# Patient Record
Sex: Female | Born: 1995
Health system: Southern US, Community
[De-identification: ages and names within clinical notes are randomized; demographics above are authoritative.]

## PROBLEM LIST (undated history)

## (undated) DIAGNOSIS — Z8659 Personal history of other mental and behavioral disorders: Secondary | ICD-10-CM

## (undated) DIAGNOSIS — F419 Anxiety disorder, unspecified: Secondary | ICD-10-CM

## (undated) DIAGNOSIS — F329 Major depressive disorder, single episode, unspecified: Secondary | ICD-10-CM

## (undated) HISTORY — PX: WISDOM TOOTH EXTRACTION: SHX21

## (undated) HISTORY — DX: Personal history of other mental and behavioral disorders: Z86.59

## (undated) HISTORY — DX: Major depressive disorder, single episode, unspecified: F32.9

## (undated) HISTORY — DX: Anxiety disorder, unspecified: F41.9

---

## 2016-02-29 DIAGNOSIS — F331 Major depressive disorder, recurrent, moderate: Secondary | ICD-10-CM | POA: Diagnosis not present

## 2016-02-29 DIAGNOSIS — F603 Borderline personality disorder: Secondary | ICD-10-CM | POA: Diagnosis not present

## 2016-03-13 DIAGNOSIS — F331 Major depressive disorder, recurrent, moderate: Secondary | ICD-10-CM | POA: Diagnosis not present

## 2016-03-13 DIAGNOSIS — F603 Borderline personality disorder: Secondary | ICD-10-CM | POA: Diagnosis not present

## 2016-03-17 DIAGNOSIS — Z113 Encounter for screening for infections with a predominantly sexual mode of transmission: Secondary | ICD-10-CM | POA: Diagnosis not present

## 2016-03-17 DIAGNOSIS — Z01419 Encounter for gynecological examination (general) (routine) without abnormal findings: Secondary | ICD-10-CM | POA: Diagnosis not present

## 2016-03-17 DIAGNOSIS — Z118 Encounter for screening for other infectious and parasitic diseases: Secondary | ICD-10-CM | POA: Diagnosis not present

## 2016-03-27 DIAGNOSIS — F603 Borderline personality disorder: Secondary | ICD-10-CM | POA: Diagnosis not present

## 2016-03-27 DIAGNOSIS — F331 Major depressive disorder, recurrent, moderate: Secondary | ICD-10-CM | POA: Diagnosis not present

## 2016-04-10 DIAGNOSIS — F331 Major depressive disorder, recurrent, moderate: Secondary | ICD-10-CM | POA: Diagnosis not present

## 2016-04-10 DIAGNOSIS — F603 Borderline personality disorder: Secondary | ICD-10-CM | POA: Diagnosis not present

## 2016-04-24 DIAGNOSIS — F331 Major depressive disorder, recurrent, moderate: Secondary | ICD-10-CM | POA: Diagnosis not present

## 2016-04-24 DIAGNOSIS — F603 Borderline personality disorder: Secondary | ICD-10-CM | POA: Diagnosis not present

## 2016-05-09 DIAGNOSIS — F603 Borderline personality disorder: Secondary | ICD-10-CM | POA: Diagnosis not present

## 2016-05-09 DIAGNOSIS — F331 Major depressive disorder, recurrent, moderate: Secondary | ICD-10-CM | POA: Diagnosis not present

## 2016-05-16 DIAGNOSIS — R0981 Nasal congestion: Secondary | ICD-10-CM | POA: Diagnosis not present

## 2016-05-16 DIAGNOSIS — J019 Acute sinusitis, unspecified: Secondary | ICD-10-CM | POA: Diagnosis not present

## 2016-05-16 DIAGNOSIS — R5383 Other fatigue: Secondary | ICD-10-CM | POA: Diagnosis not present

## 2016-05-25 DIAGNOSIS — F331 Major depressive disorder, recurrent, moderate: Secondary | ICD-10-CM | POA: Diagnosis not present

## 2016-05-25 DIAGNOSIS — F603 Borderline personality disorder: Secondary | ICD-10-CM | POA: Diagnosis not present

## 2016-06-06 DIAGNOSIS — F331 Major depressive disorder, recurrent, moderate: Secondary | ICD-10-CM | POA: Diagnosis not present

## 2016-06-06 DIAGNOSIS — F603 Borderline personality disorder: Secondary | ICD-10-CM | POA: Diagnosis not present

## 2016-07-19 DIAGNOSIS — F603 Borderline personality disorder: Secondary | ICD-10-CM | POA: Diagnosis not present

## 2016-07-19 DIAGNOSIS — F331 Major depressive disorder, recurrent, moderate: Secondary | ICD-10-CM | POA: Diagnosis not present

## 2016-08-09 DIAGNOSIS — F331 Major depressive disorder, recurrent, moderate: Secondary | ICD-10-CM | POA: Diagnosis not present

## 2016-08-09 DIAGNOSIS — F603 Borderline personality disorder: Secondary | ICD-10-CM | POA: Diagnosis not present

## 2016-08-23 DIAGNOSIS — F603 Borderline personality disorder: Secondary | ICD-10-CM | POA: Diagnosis not present

## 2016-08-23 DIAGNOSIS — F331 Major depressive disorder, recurrent, moderate: Secondary | ICD-10-CM | POA: Diagnosis not present

## 2016-11-06 DIAGNOSIS — F3281 Premenstrual dysphoric disorder: Secondary | ICD-10-CM | POA: Diagnosis not present

## 2016-11-27 DIAGNOSIS — F3281 Premenstrual dysphoric disorder: Secondary | ICD-10-CM | POA: Diagnosis not present

## 2017-01-01 DIAGNOSIS — F3281 Premenstrual dysphoric disorder: Secondary | ICD-10-CM | POA: Diagnosis not present

## 2017-01-13 DIAGNOSIS — F3281 Premenstrual dysphoric disorder: Secondary | ICD-10-CM | POA: Diagnosis not present

## 2017-01-24 DIAGNOSIS — F3281 Premenstrual dysphoric disorder: Secondary | ICD-10-CM | POA: Diagnosis not present

## 2017-01-31 DIAGNOSIS — Z23 Encounter for immunization: Secondary | ICD-10-CM | POA: Diagnosis not present

## 2017-01-31 DIAGNOSIS — Z3009 Encounter for other general counseling and advice on contraception: Secondary | ICD-10-CM | POA: Diagnosis not present

## 2017-01-31 DIAGNOSIS — F329 Major depressive disorder, single episode, unspecified: Secondary | ICD-10-CM | POA: Diagnosis not present

## 2017-02-05 DIAGNOSIS — F3281 Premenstrual dysphoric disorder: Secondary | ICD-10-CM | POA: Diagnosis not present

## 2017-02-13 DIAGNOSIS — Z3202 Encounter for pregnancy test, result negative: Secondary | ICD-10-CM | POA: Diagnosis not present

## 2017-02-13 DIAGNOSIS — Z30430 Encounter for insertion of intrauterine contraceptive device: Secondary | ICD-10-CM | POA: Diagnosis not present

## 2017-02-15 DIAGNOSIS — F3281 Premenstrual dysphoric disorder: Secondary | ICD-10-CM | POA: Diagnosis not present

## 2017-02-22 DIAGNOSIS — F3281 Premenstrual dysphoric disorder: Secondary | ICD-10-CM | POA: Diagnosis not present

## 2017-03-08 DIAGNOSIS — F3281 Premenstrual dysphoric disorder: Secondary | ICD-10-CM | POA: Diagnosis not present

## 2017-03-13 DIAGNOSIS — Z30431 Encounter for routine checking of intrauterine contraceptive device: Secondary | ICD-10-CM | POA: Diagnosis not present

## 2017-03-22 DIAGNOSIS — F3281 Premenstrual dysphoric disorder: Secondary | ICD-10-CM | POA: Diagnosis not present

## 2017-04-13 ENCOUNTER — Ambulatory Visit (HOSPITAL_COMMUNITY)
Admission: EM | Admit: 2017-04-13 | Discharge: 2017-04-13 | Disposition: A | Discharge status: Home / Self Care | Payer: Federal, State, Local not specified - PPO | Attending: Family Medicine | Admitting: Family Medicine

## 2017-04-13 ENCOUNTER — Encounter (HOSPITAL_COMMUNITY): Payer: Self-pay | Admitting: Emergency Medicine

## 2017-04-13 DIAGNOSIS — Z79899 Other long term (current) drug therapy: Secondary | ICD-10-CM | POA: Insufficient documentation

## 2017-04-13 DIAGNOSIS — R102 Pelvic and perineal pain: Secondary | ICD-10-CM | POA: Diagnosis not present

## 2017-04-13 DIAGNOSIS — Z3202 Encounter for pregnancy test, result negative: Secondary | ICD-10-CM | POA: Diagnosis not present

## 2017-04-13 DIAGNOSIS — Z975 Presence of (intrauterine) contraceptive device: Secondary | ICD-10-CM | POA: Diagnosis not present

## 2017-04-13 LAB — POCT URINALYSIS DIP (DEVICE)
Bilirubin Urine: NEGATIVE
Glucose, UA: NEGATIVE mg/dL
Nitrite: NEGATIVE
PH: 5.5 (ref 5.0–8.0)
PROTEIN: NEGATIVE mg/dL
UROBILINOGEN UA: 0.2 mg/dL (ref 0.0–1.0)

## 2017-04-13 LAB — POCT PREGNANCY, URINE: Preg Test, Ur: NEGATIVE

## 2017-04-13 MED ORDER — CIPROFLOXACIN HCL 500 MG PO TABS: 500.0000 mg | ORAL_TABLET | Freq: Two times a day (BID) | ORAL | 0 refills | Status: DC

## 2017-04-13 MED ORDER — DOXYCYCLINE HYCLATE 100 MG PO TABS: 100.0000 mg | ORAL_TABLET | Freq: Two times a day (BID) | ORAL | 0 refills | Status: DC

## 2017-04-13 NOTE — ED Provider Notes (Signed)
[facility]ENTER   [id number] 04/13/17 Arrival Time: 1336   SUBJECTIVE:  Karen Poole is a 22 y.o. female who presents to the urgent care with complaint of pelvic pain for 2-3 days.   IUD insertion 2 months ago. PT cannot feel IUD strings. PT's OB is in [place]  History reviewed. No pertinent past medical history. No family history on file. Social History   Socioeconomic History  . Marital status: Single    Spouse name: Not on file  . Number of children: Not on file  . Years of education: Not on file  . Highest education level: Not on file  Social Needs  . Financial resource strain: Not on file  . Food insecurity - worry: Not on file  . Food insecurity - inability: Not on file  . Transportation needs - medical: Not on file  . Transportation needs - non-medical: Not on file  Occupational History  . Not on file  Tobacco Use  . Smoking status: Not on file  Substance and Sexual Activity  . Alcohol use: Not on file  . Drug use: Not on file  . Sexual activity: Not on file  Other Topics Concern  . Not on file  Social History Narrative  . Not on file   Current Meds  Medication Sig  . FLUoxetine (PROZAC) 20 MG tablet Take 20 mg by mouth daily.   No Known Allergies    ROS: As per HPI, remainder of ROS negative.   OBJECTIVE:   Vitals:   04/13/17 1445 04/13/17 1446  BP:  (!) 97/54  Pulse:  83  Resp:  16  Temp:  97.8 F (36.6 C)  TempSrc:  Temporal  SpO2:  100%  Weight: 170 lb (77.1 kg)      General appearance: alert; no distress Eyes: PERRL; EOMI; conjunctiva normal HENT: normocephalic; atraumatic;oral mucosa normal Neck: supple Lungs: clear to auscultation bilaterally Heart: regular rate and rhythm Abdomen: soft, diffusely tender; bowel sounds normal; no masses or organomegaly; there is guarding or rebound tenderness;  The IUD string is in place;  There is some cervical motion tenderness.; NEFG;  Minimal menstrual blood in  vault Back: no CVA tenderness Extremities: no cyanosis or edema; symmetrical with no gross deformities Skin: warm and dry Neurologic: normal gait; grossly normal Psychological: alert and cooperative; normal mood and affect      Labs:  Results for orders placed or performed during the hospital encounter of 04/13/17  POCT urinalysis dip (device)  Result Value Ref Range   Glucose, UA NEGATIVE NEGATIVE mg/dL   Bilirubin Urine NEGATIVE NEGATIVE   Ketones, ur TRACE (A) NEGATIVE mg/dL   Specific Gravity, Urine >=1.030 1.005 - 1.030   Hgb urine dipstick LARGE (A) NEGATIVE   pH 5.5 5.0 - 8.0   Protein, ur NEGATIVE NEGATIVE mg/dL   Urobilinogen, UA 0.2 0.0 - 1.0 mg/dL   Nitrite NEGATIVE NEGATIVE   Leukocytes, UA TRACE (A) NEGATIVE  Pregnancy, urine POC  Result Value Ref Range   Preg Test, Ur NEGATIVE NEGATIVE    Labs Reviewed  POCT URINALYSIS DIP (DEVICE) - Abnormal; Notable for the following components:      Result Value   Ketones, ur TRACE (*)    Hgb urine dipstick LARGE (*)    Leukocytes, UA TRACE (*)    All other components within normal limits  URINE CULTURE  POCT PREGNANCY, URINE  URINE CYTOLOGY ANCILLARY ONLY    No results found.     ASSESSMENT & PLAN:  1. Pelvic pain in female   If not improving as expected, [facility]  Meds ordered this encounter  Medications  . ciprofloxacin (CIPRO) 500 MG tablet    Sig: Take 1 tablet (500 mg total) by mouth 2 (two) times daily.    Dispense:  14 tablet    Refill:  0  . doxycycline (VIBRA-TABS) 100 MG tablet    Sig: Take 1 tablet (100 mg total) by mouth 2 (two) times daily.    Dispense:  14 tablet    Refill:  0    Reviewed expectations re: course of current medical issues. Questions answered. Outlined signs and symptoms indicating need for more acute intervention. Patient verbalized understanding. After Visit Summary given.    Procedures:      Elvina SidleLauenstein, Jeannene Tschetter, MD 04/13/17 1538

## 2017-04-13 NOTE — Discharge Instructions (Signed)
If not improving as expected, go to [facility]

## 2017-04-13 NOTE — ED Triage Notes (Signed)
PT reports pelvic pain for 2-3 days.   IUD insertion 2 months ago. PT cannot feel IUD strings. PT's OB is in [place]

## 2017-04-14 LAB — URINE CULTURE: Culture: NO GROWTH

## 2017-04-16 LAB — URINE CYTOLOGY ANCILLARY ONLY
Chlamydia: POSITIVE — AB
Neisseria Gonorrhea: NEGATIVE
Trichomonas: NEGATIVE

## 2017-04-18 LAB — URINE CYTOLOGY ANCILLARY ONLY
Bacterial vaginitis: NEGATIVE
Candida vaginitis: NEGATIVE

## 2017-05-03 DIAGNOSIS — F3281 Premenstrual dysphoric disorder: Secondary | ICD-10-CM | POA: Diagnosis not present

## 2017-05-17 DIAGNOSIS — F3281 Premenstrual dysphoric disorder: Secondary | ICD-10-CM | POA: Diagnosis not present

## 2017-05-31 DIAGNOSIS — F3281 Premenstrual dysphoric disorder: Secondary | ICD-10-CM | POA: Diagnosis not present

## 2017-06-26 DIAGNOSIS — Z01419 Encounter for gynecological examination (general) (routine) without abnormal findings: Secondary | ICD-10-CM | POA: Diagnosis not present

## 2017-06-26 DIAGNOSIS — Z113 Encounter for screening for infections with a predominantly sexual mode of transmission: Secondary | ICD-10-CM | POA: Diagnosis not present

## 2017-06-26 DIAGNOSIS — Z124 Encounter for screening for malignant neoplasm of cervix: Secondary | ICD-10-CM | POA: Diagnosis not present

## 2017-06-26 DIAGNOSIS — Z6829 Body mass index (BMI) 29.0-29.9, adult: Secondary | ICD-10-CM | POA: Diagnosis not present

## 2017-06-26 DIAGNOSIS — Z1389 Encounter for screening for other disorder: Secondary | ICD-10-CM | POA: Diagnosis not present

## 2017-06-26 DIAGNOSIS — Z975 Presence of (intrauterine) contraceptive device: Secondary | ICD-10-CM | POA: Diagnosis not present

## 2017-06-26 DIAGNOSIS — Z130 Encounter for screening for diseases of the blood and blood-forming organs and certain disorders involving the immune mechanism: Secondary | ICD-10-CM | POA: Diagnosis not present

## 2017-06-27 DIAGNOSIS — Z124 Encounter for screening for malignant neoplasm of cervix: Secondary | ICD-10-CM | POA: Diagnosis not present

## 2017-06-27 LAB — HM PAP SMEAR: HM PAP: NEGATIVE

## 2017-08-01 DIAGNOSIS — Z6830 Body mass index (BMI) 30.0-30.9, adult: Secondary | ICD-10-CM | POA: Diagnosis not present

## 2017-08-01 DIAGNOSIS — M62830 Muscle spasm of back: Secondary | ICD-10-CM | POA: Diagnosis not present

## 2017-10-04 DIAGNOSIS — F3281 Premenstrual dysphoric disorder: Secondary | ICD-10-CM | POA: Diagnosis not present

## 2017-10-17 DIAGNOSIS — F3281 Premenstrual dysphoric disorder: Secondary | ICD-10-CM | POA: Diagnosis not present

## 2017-10-17 LAB — HM COLONOSCOPY

## 2017-10-24 DIAGNOSIS — F3281 Premenstrual dysphoric disorder: Secondary | ICD-10-CM | POA: Diagnosis not present

## 2017-10-31 DIAGNOSIS — F3281 Premenstrual dysphoric disorder: Secondary | ICD-10-CM | POA: Diagnosis not present

## 2017-11-14 DIAGNOSIS — F3281 Premenstrual dysphoric disorder: Secondary | ICD-10-CM | POA: Diagnosis not present

## 2017-11-26 ENCOUNTER — Ambulatory Visit: Payer: Self-pay | Admitting: Family Medicine

## 2017-11-28 DIAGNOSIS — F3281 Premenstrual dysphoric disorder: Secondary | ICD-10-CM | POA: Diagnosis not present

## 2017-11-29 DIAGNOSIS — R350 Frequency of micturition: Secondary | ICD-10-CM | POA: Diagnosis not present

## 2017-12-01 DIAGNOSIS — F411 Generalized anxiety disorder: Secondary | ICD-10-CM | POA: Diagnosis not present

## 2017-12-12 DIAGNOSIS — F3281 Premenstrual dysphoric disorder: Secondary | ICD-10-CM | POA: Diagnosis not present

## 2017-12-21 DIAGNOSIS — F3281 Premenstrual dysphoric disorder: Secondary | ICD-10-CM | POA: Diagnosis not present

## 2018-01-09 DIAGNOSIS — F3281 Premenstrual dysphoric disorder: Secondary | ICD-10-CM | POA: Diagnosis not present

## 2018-01-21 ENCOUNTER — Ambulatory Visit (INDEPENDENT_AMBULATORY_CARE_PROVIDER_SITE_OTHER): Payer: BLUE CROSS/BLUE SHIELD | Admitting: Family Medicine

## 2018-01-21 ENCOUNTER — Encounter: Payer: Self-pay | Admitting: Family Medicine

## 2018-01-21 ENCOUNTER — Other Ambulatory Visit: Payer: Self-pay | Admitting: Internal Medicine

## 2018-01-21 ENCOUNTER — Ambulatory Visit
Admission: RE | Admit: 2018-01-21 | Discharge: 2018-01-21 | Disposition: A | Discharge status: Home / Self Care | Payer: Federal, State, Local not specified - PPO | Source: Ambulatory Visit | Attending: Family Medicine | Admitting: Family Medicine

## 2018-01-21 VITALS — BP 110/70 | HR 59 | Ht 67.0 in | Wt 189.6 lb

## 2018-01-21 DIAGNOSIS — Z8349 Family history of other endocrine, nutritional and metabolic diseases: Secondary | ICD-10-CM | POA: Insufficient documentation

## 2018-01-21 DIAGNOSIS — Z0000 Encounter for general adult medical examination without abnormal findings: Secondary | ICD-10-CM | POA: Diagnosis not present

## 2018-01-21 DIAGNOSIS — M545 Low back pain, unspecified: Secondary | ICD-10-CM

## 2018-01-21 DIAGNOSIS — Z1329 Encounter for screening for other suspected endocrine disorder: Secondary | ICD-10-CM | POA: Diagnosis not present

## 2018-01-21 DIAGNOSIS — G8929 Other chronic pain: Secondary | ICD-10-CM

## 2018-01-21 DIAGNOSIS — F419 Anxiety disorder, unspecified: Secondary | ICD-10-CM

## 2018-01-21 DIAGNOSIS — F32A Depression, unspecified: Secondary | ICD-10-CM | POA: Insufficient documentation

## 2018-01-21 DIAGNOSIS — F329 Major depressive disorder, single episode, unspecified: Secondary | ICD-10-CM | POA: Insufficient documentation

## 2018-01-21 DIAGNOSIS — Z8659 Personal history of other mental and behavioral disorders: Secondary | ICD-10-CM

## 2018-01-21 HISTORY — DX: Personal history of other mental and behavioral disorders: Z86.59

## 2018-01-21 HISTORY — DX: Depression, unspecified: F32.A

## 2018-01-21 HISTORY — DX: Anxiety disorder, unspecified: F41.9

## 2018-01-21 LAB — POCT URINALYSIS DIP (PROADVANTAGE DEVICE)
BILIRUBIN UA: NEGATIVE
Blood, UA: NEGATIVE
GLUCOSE UA: NEGATIVE mg/dL
Ketones, POC UA: NEGATIVE mg/dL
LEUKOCYTES UA: NEGATIVE
NITRITE UA: NEGATIVE
Protein Ur, POC: NEGATIVE mg/dL
Specific Gravity, Urine: 1.025
Urobilinogen, Ur: NEGATIVE
pH, UA: 6 (ref 5.0–8.0)

## 2018-01-21 MED ORDER — MELOXICAM 7.5 MG PO TABS: 7.5000 mg | ORAL_TABLET | Freq: Every day | ORAL | 0 refills | Status: DC

## 2018-01-21 NOTE — Patient Instructions (Addendum)
Go to [facility] to get your XR at your convenience.  I am referring you to physical therapy and they will call you.   Chronic Back Pain When back pain lasts longer than 3 months, it is called chronic back pain. Pain may get worse at certain times (flare-ups). There are things you can do at home to manage your pain. Follow these instructions at home: Activity      Avoid bending and other activities that make pain worse.  When standing: ? Keep your upper back and neck straight. ? Keep your shoulders pulled back. ? Avoid slouching.  When sitting: ? Keep your back straight. ? Relax your shoulders. Do not round your shoulders or pull them backward.  Do not sit or stand in one place for long periods of time.  Take short rest breaks during the day. Lying down or standing is usually better than sitting. Resting can help relieve pain.  When sitting or lying down for a long time, do some mild activity or stretching. This will help to prevent stiffness and pain.  Get regular exercise. Ask your doctor what activities are safe for you.  Do not lift anything that is heavier than 10 lb (4.5 kg). To prevent injury when you lift things: ? Bend your knees. ? Keep the weight close to your body. ? Avoid twisting. Managing pain  If told, put ice on the painful area. Your doctor may tell you to use ice for 24-48 hours after a flare-up starts. ? Put ice in a plastic bag. ? Place a towel between your skin and the bag. ? Leave the ice on for 20 minutes, 2-3 times a day.  If told, put heat on the painful area as often as told by your doctor. Use the heat source that your doctor recommends, such as a moist heat pack or a heating pad. ? Place a towel between your skin and the heat source. ? Leave the heat on for 20-30 minutes. ? Remove the heat if your skin turns bright red. This is especially important if you are unable to feel pain, heat, or cold. You may have a greater risk of getting  burned.  Soak in a warm bath. This can help relieve pain.  Take over-the-counter and prescription medicines only as told by your doctor. General instructions  Sleep on a firm mattress. Try lying on your side with your knees slightly bent. If you lie on your back, put a pillow under your knees.  Keep all follow-up visits as told by your doctor. This is important. Contact a doctor if:  You have pain that does not get better with rest or medicine. Get help right away if:  One or both of your arms or legs feel weak.  One or both of your arms or legs lose feeling (numbness).  You have trouble controlling when you poop (bowel movement) or pee (urinate).  You feel sick to your stomach (nauseous).  You throw up (vomit).  You have belly (abdominal) pain.  You have shortness of breath.  You pass out (faint). Summary  When back pain lasts longer than 3 months, it is called chronic back pain.  Pain may get worse at certain times (flare-ups).  Use ice and heat as told by your doctor. Your doctor may tell you to use ice after flare-ups. This information is not intended to replace advice given to you by your health care provider. Make sure you discuss any questions you have with your health  care provider. Document Released: 07/05/2007 Document Revised: 08/31/2016 Document Reviewed: 08/31/2016 Elsevier Interactive Patient Education  2019 [place] 18-39 Years, Female Preventive care refers to lifestyle choices and visits with your health care provider that can promote health and wellness. What does preventive care include?   A yearly physical exam. This is also called an annual well check.  Dental exams once or twice a year.  Routine eye exams. Ask your health care provider how often you should have your eyes checked.  Personal lifestyle choices, including: ? Daily care of your teeth and gums. ? Regular physical activity. ? Eating a healthy  diet. ? Avoiding tobacco and drug use. ? Limiting alcohol use. ? Practicing safe sex. ? Taking vitamin and mineral supplements as recommended by your health care provider. What happens during an annual well check? The services and screenings done by your health care provider during your annual well check will depend on your age, overall health, lifestyle risk factors, and family history of disease. Counseling Your health care provider may ask you questions about your:  Alcohol use.  Tobacco use.  Drug use.  Emotional well-being.  Home and relationship well-being.  Sexual activity.  Eating habits.  Work and work Statistician.  Method of birth control.  Menstrual cycle.  Pregnancy history. Screening You may have the following tests or measurements:  Height, weight, and BMI.  Diabetes screening. This is done by checking your blood sugar (glucose) after you have not eaten for a while (fasting).  Blood pressure.  Lipid and cholesterol levels. These may be checked every 5 years starting at age 48.  Skin check.  Hepatitis C blood test.  Hepatitis B blood test.  Sexually transmitted disease (STD) testing.  BRCA-related cancer screening. This may be done if you have a family history of breast, ovarian, tubal, or peritoneal cancers.  Pelvic exam and Pap test. This may be done every 3 years starting at age 34. Starting at age 30, this may be done every 5 years if you have a Pap test in combination with an HPV test. Discuss your test results, treatment options, and if necessary, the need for more tests with your health care provider. Vaccines Your health care provider may recommend certain vaccines, such as:  Influenza vaccine. This is recommended every year.  Tetanus, diphtheria, and acellular pertussis (Tdap, Td) vaccine. You may need a Td booster every 10 years.  Varicella vaccine. You may need this if you have not been vaccinated.  HPV vaccine. If you are 71 or  younger, you may need three doses over 6 months.  Measles, mumps, and rubella (MMR) vaccine. You may need at least one dose of MMR. You may also need a second dose.  Pneumococcal 13-valent conjugate (PCV13) vaccine. You may need this if you have certain conditions and were not previously vaccinated.  Pneumococcal polysaccharide (PPSV23) vaccine. You may need one or two doses if you smoke cigarettes or if you have certain conditions.  Meningococcal vaccine. One dose is recommended if you are age 58-21 years and a first-year college student living in a residence hall, or if you have one of several medical conditions. You may also need additional booster doses.  Hepatitis A vaccine. You may need this if you have certain conditions or if you travel or work in places where you may be exposed to hepatitis A.  Hepatitis B vaccine. You may need this if you have certain conditions or if you travel or work in  places where you may be exposed to hepatitis B.  Haemophilus influenzae type b (Hib) vaccine. You may need this if you have certain risk factors. Talk to your health care provider about which screenings and vaccines you need and how often you need them. This information is not intended to replace advice given to you by your health care provider. Make sure you discuss any questions you have with your health care provider. Document Released: 03/14/2001 Document Revised: 08/29/2016 Document Reviewed: 11/17/2014 Elsevier Interactive Patient Education  2019 [employer].

## 2018-01-21 NOTE — Progress Notes (Signed)
Subjective:    Patient ID: Karen Poole, female    DOB: 09-18-1995, 22 y.o.   MRN: [id number]  HPI Chief Complaint  Patient presents with  . cpe    cpe, back pain- month and half- bending forward and back to much. sees obgyn, gets eyes check yearly. declines flu shot   She is new to the practice and here for a complete physical exam.  She also has a complaint of low back pain. [place] medical care[place] in [place], [place]   Other providers: [facility] - [facility]  Eyes  Counselor   Complains of 4-6 month history of intermittent low back pain that is non radiating. Pain is gradually worsening and more constant.  Describes pain as a dull ache and sharp with certain movements.  No known injury. Sitting for long periods and leaning forward makes the pain worse. Laying down improves the pain. Laying on her stomach also helps.  History back "spasms" per patient. States she was seen and prescribed muscle relaxants.   Has tried heating pad, taking Aleve. These therapies help temporarily.  No history of steroid use. No fever, chills. Denies loss of control of bowels or bladder or urinary retention. Denies numbness, tingling or weakness.   She does wear a heavy backpack at college.   Urinary frequency and hesitancy one month ago.  These symptoms did resolve.  Social history: Lives in university apartments. works PT at [employer]. [employer] social work degree as of next year.  Denies smoking cigarettes.  Drinks alcohol socially, smokes marijuana daily.  Diet: fairly healthy. Late night snacks.  Excerise: walks daily. 2 times per week   Immunizations: reports being UTD including Gardasil. No records available.   Health maintenance:  Mammogram: N/A Colonoscopy: N/A Last Gynecological Exam: Jun 26 2017  Last Menstrual cycle: 01/14/2018 IUD and condoms Pregnancies: 0 Last Dental Exam: last year  Last Eye Exam: 2 months ago   Wears seatbelt always, uses sunscreen,  smoke detectors in home and functioning, does not text while driving and feels safe in home environment.   Reviewed allergies, medications, past medical, surgical, family, and social history.     Review of Systems Review of Systems Constitutional: -fever, -chills, -sweats, -unexpected weight change,-fatigue ENT: -runny nose, -ear pain, -sore throat Cardiology:  -chest pain, -palpitations, -edema Respiratory: -cough, -shortness of breath, -wheezing Gastroenterology: -abdominal pain, -nausea, -vomiting, -diarrhea, -constipation  Hematology: -bleeding or bruising problems Musculoskeletal: -arthralgias, -myalgias, -joint swelling, +back pain Ophthalmology: -vision changes Urology: -dysuria, -difficulty urinating, -hematuria, -urinary frequency, -urgency Neurology: -headache, -weakness, -tingling, -numbness       Objective:   Physical Exam BP 110/70   Pulse (!) 59   Ht 5\' 7"  (1.702 m)   Wt 189 lb 9.6 oz (86 kg)   LMP 01/14/2018   BMI 29.70 kg/m   General Appearance:    Alert, cooperative, no distress, appears stated age  Head:    Normocephalic, without obvious abnormality, atraumatic  Eyes:    PERRL, conjunctiva/corneas clear, EOM's intact, fundi    benign  Ears:    Normal TM's and external ear canals  Nose:   Nares normal, mucosa normal, no drainage or sinus   tenderness  Throat:   Lips, mucosa, and tongue normal; teeth and gums normal  Neck:   Supple, no lymphadenopathy;  thyroid:  no   enlargement/tenderness/nodules; no carotid   bruit or JVD  Back:    cervical and thoracic spine nontender. Lumbar spine and lumbar paraspinal muscle TTP. Limited  ROM and pain with flexion, extension and rotation. Negative straight leg weakness, DTRs normal and symmetric.   Lungs:     Clear to auscultation bilaterally without wheezes, rales or     ronchi; respirations unlabored  Chest Wall:    No tenderness or deformity   Heart:    Regular rate and rhythm, S1 and S2 normal, no murmur, rub   or  gallop  Breast Exam:    OB/GYN  Abdomen:     Soft, non-tender, nondistended, normoactive bowel sounds,    no masses, no hepatosplenomegaly  Genitalia:    OB/GYN  Rectal:    Not performed due to age<40 and no related complaints  Extremities:   No clubbing, cyanosis or edema  Pulses:   2+ and symmetric all extremities  Skin:   Skin color, texture, turgor normal, no rashes or lesions  Lymph nodes:   Cervical, supraclavicular, and axillary nodes normal  Neurologic:   CNII-XII intact, normal strength, sensation and gait; reflexes 2+ and symmetric throughout          Psych:   Normal mood, affect, hygiene and grooming.     Urinalysis dipstick: negative       Assessment & Plan:  Routine general medical examination at a health care facility - Plan: POCT Urinalysis DIP (Proadvantage Device), TSH, T4, free, CBC with Differential/Platelet, Comprehensive metabolic panel  Chronic bilateral low back pain without sciatica - Plan: DG Lumbar Spine Complete, POCT Urinalysis DIP (Proadvantage Device), Ambulatory referral to Physical Therapy  Screening for thyroid disorder - Plan: TSH, T4, free  Family history of thyroid disease in sister - Plan: TSH, T4, free  Family history of thyroid disease in mother - Plan: TSH, T4, free  She is a 22 year old pleasant female who is new to the practice and here for a CPE.  She also has complaints of ongoing low back pain for several months.  No known injury of her back.  Denies previous x-ray or work-up for back pain.  No red flag symptoms. I will send her for lumbar spine x-ray and physical therapy Will get records including pap smear and immunizations  Family history significant for thyroid disease.  She requests thyroid panel today.  She declines lipid screening. Counseling on healthy diet and exercise.  I also encouraged her to stop smoking marijuana since this may interfere with her medication as well have potential long term health consequences.  Follow up  pending results.

## 2018-01-22 LAB — COMPREHENSIVE METABOLIC PANEL
ALBUMIN: 4.6 g/dL (ref 3.5–5.5)
ALK PHOS: 91 IU/L (ref 39–117)
ALT: 24 IU/L (ref 0–32)
AST: 24 IU/L (ref 0–40)
Albumin/Globulin Ratio: 2.2 (ref 1.2–2.2)
BUN / CREAT RATIO: 13 (ref 9–23)
BUN: 12 mg/dL (ref 6–20)
Bilirubin Total: 0.4 mg/dL (ref 0.0–1.2)
CHLORIDE: 101 mmol/L (ref 96–106)
CO2: 24 mmol/L (ref 20–29)
CREATININE: 0.89 mg/dL (ref 0.57–1.00)
Calcium: 8.9 mg/dL (ref 8.7–10.2)
GFR calc Af Amer: 107 mL/min/{1.73_m2} (ref >59)
GFR calc non Af Amer: 93 mL/min/{1.73_m2} (ref >59)
GLUCOSE: 92 mg/dL (ref 65–99)
Globulin, Total: 2.1 g/dL (ref 1.5–4.5)
Potassium: 4.2 mmol/L (ref 3.5–5.2)
Sodium: 138 mmol/L (ref 134–144)
TOTAL PROTEIN: 6.7 g/dL (ref 6.0–8.5)

## 2018-01-22 LAB — CBC WITH DIFFERENTIAL/PLATELET
BASOS ABS: 0.1 10*3/uL (ref 0.0–0.2)
Basos: 1 %
EOS (ABSOLUTE): 0.2 10*3/uL (ref 0.0–0.4)
Eos: 2 %
HEMOGLOBIN: 11.9 g/dL (ref 11.1–15.9)
Hematocrit: 36.8 % (ref 34.0–46.6)
IMMATURE GRANS (ABS): 0 10*3/uL (ref 0.0–0.1)
Immature Granulocytes: 0 %
LYMPHS: 25 %
Lymphocytes Absolute: 2 10*3/uL (ref 0.7–3.1)
MCH: 27.7 pg (ref 26.6–33.0)
MCHC: 32.3 g/dL (ref 31.5–35.7)
MCV: 86 fL (ref 79–97)
MONOCYTES: 10 %
Monocytes Absolute: 0.8 10*3/uL (ref 0.1–0.9)
Neutrophils Absolute: 5.1 10*3/uL (ref 1.4–7.0)
Neutrophils: 62 %
PLATELETS: 203 10*3/uL (ref 150–450)
RBC: 4.29 x10E6/uL (ref 3.77–5.28)
RDW: 13.3 % (ref 12.3–15.4)
WBC: 8.1 10*3/uL (ref 3.4–10.8)

## 2018-01-22 LAB — TSH: TSH: 3.99 u[IU]/mL (ref 0.450–4.500)

## 2018-01-22 LAB — T4, FREE: FREE T4: 1.14 ng/dL (ref 0.82–1.77)

## 2018-02-06 DIAGNOSIS — F3281 Premenstrual dysphoric disorder: Secondary | ICD-10-CM | POA: Diagnosis not present

## 2018-02-11 DIAGNOSIS — M25561 Pain in right knee: Secondary | ICD-10-CM | POA: Diagnosis not present

## 2018-02-11 DIAGNOSIS — M545 Low back pain: Secondary | ICD-10-CM | POA: Diagnosis not present

## 2018-02-11 DIAGNOSIS — M25562 Pain in left knee: Secondary | ICD-10-CM | POA: Diagnosis not present

## 2018-02-15 ENCOUNTER — Telehealth: Payer: Self-pay | Admitting: Family Medicine

## 2018-02-15 DIAGNOSIS — N390 Urinary tract infection, site not specified: Secondary | ICD-10-CM | POA: Diagnosis not present

## 2018-02-15 DIAGNOSIS — M545 Low back pain: Secondary | ICD-10-CM | POA: Diagnosis not present

## 2018-02-15 DIAGNOSIS — M25561 Pain in right knee: Secondary | ICD-10-CM | POA: Diagnosis not present

## 2018-02-15 DIAGNOSIS — M25562 Pain in left knee: Secondary | ICD-10-CM | POA: Diagnosis not present

## 2018-02-15 DIAGNOSIS — R350 Frequency of micturition: Secondary | ICD-10-CM | POA: Diagnosis not present

## 2018-02-15 NOTE — Telephone Encounter (Signed)
Received requested records from [facility]. Received pap. Sending back for review.

## 2018-02-18 DIAGNOSIS — M545 Low back pain: Secondary | ICD-10-CM | POA: Diagnosis not present

## 2018-02-18 DIAGNOSIS — M25562 Pain in left knee: Secondary | ICD-10-CM | POA: Diagnosis not present

## 2018-02-18 DIAGNOSIS — M25561 Pain in right knee: Secondary | ICD-10-CM | POA: Diagnosis not present

## 2018-02-20 DIAGNOSIS — F3281 Premenstrual dysphoric disorder: Secondary | ICD-10-CM | POA: Diagnosis not present

## 2018-02-22 ENCOUNTER — Encounter: Payer: Self-pay | Admitting: Internal Medicine

## 2018-02-22 DIAGNOSIS — M25562 Pain in left knee: Secondary | ICD-10-CM | POA: Diagnosis not present

## 2018-02-22 DIAGNOSIS — M25561 Pain in right knee: Secondary | ICD-10-CM | POA: Diagnosis not present

## 2018-02-22 DIAGNOSIS — M545 Low back pain: Secondary | ICD-10-CM | POA: Diagnosis not present

## 2018-02-25 DIAGNOSIS — M25561 Pain in right knee: Secondary | ICD-10-CM | POA: Diagnosis not present

## 2018-02-25 DIAGNOSIS — M25562 Pain in left knee: Secondary | ICD-10-CM | POA: Diagnosis not present

## 2018-02-25 DIAGNOSIS — M545 Low back pain: Secondary | ICD-10-CM | POA: Diagnosis not present

## 2018-03-01 DIAGNOSIS — M25561 Pain in right knee: Secondary | ICD-10-CM | POA: Diagnosis not present

## 2018-03-01 DIAGNOSIS — M545 Low back pain: Secondary | ICD-10-CM | POA: Diagnosis not present

## 2018-03-01 DIAGNOSIS — M25562 Pain in left knee: Secondary | ICD-10-CM | POA: Diagnosis not present

## 2018-03-06 DIAGNOSIS — F3281 Premenstrual dysphoric disorder: Secondary | ICD-10-CM | POA: Diagnosis not present

## 2018-03-07 ENCOUNTER — Telehealth: Payer: Self-pay | Admitting: Family Medicine

## 2018-03-07 NOTE — Telephone Encounter (Signed)
Received requested records from [facility]. Sending back for review.

## 2018-03-18 ENCOUNTER — Encounter: Payer: Self-pay | Admitting: Family Medicine

## 2018-03-22 ENCOUNTER — Encounter: Payer: Self-pay | Admitting: Internal Medicine

## 2018-03-27 DIAGNOSIS — F3281 Premenstrual dysphoric disorder: Secondary | ICD-10-CM | POA: Diagnosis not present

## 2018-04-17 DIAGNOSIS — F3281 Premenstrual dysphoric disorder: Secondary | ICD-10-CM | POA: Diagnosis not present

## 2018-04-30 DIAGNOSIS — F3281 Premenstrual dysphoric disorder: Secondary | ICD-10-CM | POA: Diagnosis not present

## 2018-05-13 ENCOUNTER — Other Ambulatory Visit: Payer: Self-pay

## 2018-05-13 ENCOUNTER — Encounter: Payer: Self-pay | Admitting: Family Medicine

## 2018-05-13 ENCOUNTER — Ambulatory Visit: Payer: Federal, State, Local not specified - PPO | Admitting: Family Medicine

## 2018-05-13 VITALS — Wt 185.0 lb

## 2018-05-13 DIAGNOSIS — M5441 Lumbago with sciatica, right side: Secondary | ICD-10-CM | POA: Diagnosis not present

## 2018-05-13 NOTE — Progress Notes (Signed)
Documentation for Telephone encounter:  This telephone service is not related to other E/M service within previous 7 days.  Patient consented to the consult. 2 patient identifiers used. She is aware that we will bill her insurance.   This telephone consult involved patient and myself, Hetty Blend, NP  Subjective: Chief Complaint  Patient presents with  . back pain    a year of back pain, went to PT and got alittle better, but now that she is not moving as much now she is not in school. back pain is affecting sex life and she is in alot of pain   Complains of a 4 week history of low back pain that is worse on her right side. Reports a history of chronic back pain. No new injury. Pain has occasionally radiated down the back of her right leg for the past 1-2 weeks. Pain is worse with sitting. Improves some when walking.  Pain with sleeping and she has to get up. States the pain is interfering with her job and with her sex life.  Denies fever, chills, unexplained weight loss, abdominal pain, N/V/D. No numbness, tingling or weakness. No loss of control of bowels or bladder.   Currently she is taking Tylenol.   Has been to PT in the past, states it did not really help.   Meloxicam in the past and she did not think this really helped.   IUD for contraception.    Objective: Wt 185 lb (83.9 kg)   LMP 04/29/2018   BMI 28.98 kg/m   Alert and oriented and in no acute distress.   Assessment: Acute right-sided low back pain with right-sided sciatica    Plan: No acute distress.  Does not have any red flag symptoms.  Reviewed lumbar XR with patient.  Take 1-2 Aleve twice daily with food. Use heat or ice 2-3 times per day for 15-20 minutes each time. Try a topical analgesic such as Salon Pas or Biofreeze.  Do the PT stretches she learned. Discussed using steroids or muscle relaxants and these do not seem appropriate at this time.   She was advised to follow up if worsening or if not  improving in 1-2 weeks. Advised of red flag symptoms.  She is currently in [place] and may want a referral to ortho if not improving. Does not want to be referred back to PT.  Answered all of her questions.    Time involving medical discussion was 12 minutes.  99441 (5-8min) 60045 (11-74min) 99774 (21-51min)

## 2018-05-21 DIAGNOSIS — F3281 Premenstrual dysphoric disorder: Secondary | ICD-10-CM | POA: Diagnosis not present

## 2018-06-14 DIAGNOSIS — F3281 Premenstrual dysphoric disorder: Secondary | ICD-10-CM | POA: Diagnosis not present

## 2018-07-02 DIAGNOSIS — F3281 Premenstrual dysphoric disorder: Secondary | ICD-10-CM | POA: Diagnosis not present

## 2018-07-04 DIAGNOSIS — Z975 Presence of (intrauterine) contraceptive device: Secondary | ICD-10-CM | POA: Diagnosis not present

## 2018-07-04 DIAGNOSIS — Z6828 Body mass index (BMI) 28.0-28.9, adult: Secondary | ICD-10-CM | POA: Diagnosis not present

## 2018-07-04 DIAGNOSIS — Z130 Encounter for screening for diseases of the blood and blood-forming organs and certain disorders involving the immune mechanism: Secondary | ICD-10-CM | POA: Diagnosis not present

## 2018-07-04 DIAGNOSIS — Z01419 Encounter for gynecological examination (general) (routine) without abnormal findings: Secondary | ICD-10-CM | POA: Diagnosis not present

## 2018-07-04 DIAGNOSIS — Z113 Encounter for screening for infections with a predominantly sexual mode of transmission: Secondary | ICD-10-CM | POA: Diagnosis not present

## 2018-07-19 ENCOUNTER — Telehealth: Payer: Self-pay | Admitting: *Deleted

## 2018-07-19 ENCOUNTER — Telehealth: Payer: Self-pay | Admitting: Family Medicine

## 2018-07-19 DIAGNOSIS — Z20822 Contact with and (suspected) exposure to covid-19: Secondary | ICD-10-CM

## 2018-07-19 NOTE — Telephone Encounter (Signed)
-----   Message from Koren Bound, [place] sent at 07/19/2018  2:41 PM EDT ----- Patient needs to be tested for Covid-19   Sabrina

## 2018-07-19 NOTE — Telephone Encounter (Signed)
Next available appointment @ [facility] for covid testing is Monday 07/22/18. Patient does not want to wait until Monday. She wants to go to [facility] to [facility] tested but needs an order from her PCP. Patient will call her PCP to request to be tested by [facility]. States she was at a protest.

## 2018-07-19 NOTE — Telephone Encounter (Signed)
Pt needs to be sent for testing because she was in a protest  Send to [facility] per Vickie Pt informed

## 2018-07-19 NOTE — Telephone Encounter (Signed)
Sent [facility] a note for testing

## 2018-07-19 NOTE — Telephone Encounter (Signed)
Pt returned call and was scheduled for testing on 07/22/18 at [facility] site. Pt advised to wear a mask and to remain in car at appt time. Understanding verbalized.

## 2018-07-22 ENCOUNTER — Other Ambulatory Visit: Payer: Federal, State, Local not specified - PPO

## 2018-07-22 DIAGNOSIS — R6889 Other general symptoms and signs: Secondary | ICD-10-CM | POA: Diagnosis not present

## 2018-07-22 DIAGNOSIS — Z20822 Contact with and (suspected) exposure to covid-19: Secondary | ICD-10-CM

## 2018-07-27 LAB — NOVEL CORONAVIRUS, NAA: SARS-CoV-2, NAA: NOT DETECTED

## 2018-07-30 DIAGNOSIS — F3281 Premenstrual dysphoric disorder: Secondary | ICD-10-CM | POA: Diagnosis not present

## 2018-08-19 DIAGNOSIS — F3281 Premenstrual dysphoric disorder: Secondary | ICD-10-CM | POA: Diagnosis not present

## 2018-09-05 ENCOUNTER — Encounter: Payer: Self-pay | Admitting: Family Medicine

## 2018-09-24 ENCOUNTER — Other Ambulatory Visit: Payer: Federal, State, Local not specified - PPO

## 2018-09-24 ENCOUNTER — Other Ambulatory Visit: Payer: Self-pay

## 2018-09-24 DIAGNOSIS — Z111 Encounter for screening for respiratory tuberculosis: Secondary | ICD-10-CM | POA: Diagnosis not present

## 2018-09-26 ENCOUNTER — Other Ambulatory Visit: Payer: Self-pay

## 2018-09-26 ENCOUNTER — Encounter: Payer: Self-pay | Admitting: Family Medicine

## 2018-09-26 ENCOUNTER — Ambulatory Visit (INDEPENDENT_AMBULATORY_CARE_PROVIDER_SITE_OTHER): Payer: Federal, State, Local not specified - PPO | Admitting: Family Medicine

## 2018-09-26 VITALS — Temp 98.6°F | Wt 180.0 lb

## 2018-09-26 DIAGNOSIS — F3281 Premenstrual dysphoric disorder: Secondary | ICD-10-CM | POA: Diagnosis not present

## 2018-09-26 DIAGNOSIS — F411 Generalized anxiety disorder: Secondary | ICD-10-CM

## 2018-09-26 LAB — QUANTIFERON-TB GOLD PLUS
QuantiFERON Mitogen Value: >10 IU/mL
QuantiFERON Nil Value: 0.02 IU/mL
QuantiFERON TB1 Ag Value: 0.02 IU/mL
QuantiFERON TB2 Ag Value: 0.02 IU/mL
QuantiFERON-TB Gold Plus: NEGATIVE

## 2018-09-26 NOTE — Progress Notes (Signed)
   Subjective:   Documentation for virtual audio and video telecommunications through [place] encounter:  The patient was located at home. 2 identifiers used.  The provider was located in the office. The patient did consent to this visit and is aware of possible charges through their insurance for this visit.  The other persons participating in this telemedicine service were none.    Patient ID: Karen Poole, female    DOB: [birth year], 23 y.o.   MRN: [id number]  HPI Chief Complaint  Patient presents with  . 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   She has been taking Prozac daily for the past 6 years for GAD.  She is also seeing a therapist. [facility] in [place].  Doing well. Mood is stable. No new concerns.  Denies feeling depressed.   Rarely drinks alcohol or uses marijuana.   Denies fever, chills, dizziness, chest pain, palpitations, shortness of breath, abdominal pain, N/V/D.    Reviewed allergies, medications, past medical, surgical, family, and social history.     Review of Systems Pertinent positives and negatives in the history of present illness.     Objective:   Physical Exam Temp 98.6 F (37 C)   Wt 180 lb (81.6 kg)   BMI 28.19 kg/m   Alert and oriented and in no acute distress.  Normal mood, thought process.      Assessment & Plan:  GAD (generalized anxiety disorder)  She is doing well on Prozac. Mood is good. No depression. GAD controlled. Continue seeing a Social worker.  Discussed that I have no hesitation filling out a form stating she is safe to work with children.  Once we have her QuantiFERON gold test result, she needs to be called so that she can come and pick up the paperwork.

## 2018-10-02 DIAGNOSIS — F3281 Premenstrual dysphoric disorder: Secondary | ICD-10-CM | POA: Diagnosis not present

## 2018-11-16 DIAGNOSIS — F3281 Premenstrual dysphoric disorder: Secondary | ICD-10-CM | POA: Diagnosis not present

## 2018-12-04 DIAGNOSIS — Z20828 Contact with and (suspected) exposure to other viral communicable diseases: Secondary | ICD-10-CM | POA: Diagnosis not present

## 2018-12-14 DIAGNOSIS — F3281 Premenstrual dysphoric disorder: Secondary | ICD-10-CM | POA: Diagnosis not present

## 2019-01-10 DIAGNOSIS — Z23 Encounter for immunization: Secondary | ICD-10-CM | POA: Diagnosis not present

## 2019-01-10 DIAGNOSIS — Z03818 Encounter for observation for suspected exposure to other biological agents ruled out: Secondary | ICD-10-CM | POA: Diagnosis not present

## 2019-01-10 DIAGNOSIS — Z20828 Contact with and (suspected) exposure to other viral communicable diseases: Secondary | ICD-10-CM | POA: Diagnosis not present

## 2019-01-21 DIAGNOSIS — F3281 Premenstrual dysphoric disorder: Secondary | ICD-10-CM | POA: Diagnosis not present

## 2019-02-02 IMAGING — CR DG LUMBAR SPINE COMPLETE 4+V
5 series · 5 of 5 positions shown · non-contrast
Comparison: None.

CLINICAL DATA: Lumbago, primarily right-sided

EXAM:
LUMBAR SPINE - COMPLETE 4+ VIEW

[t l-spine a.p.]
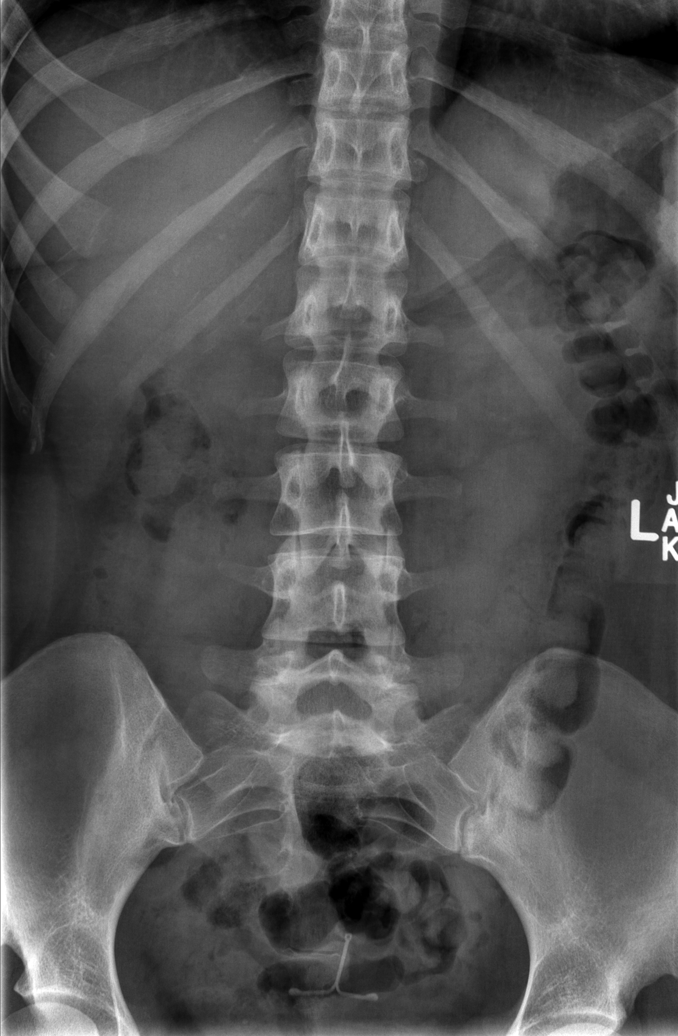

[t l-spine oblique exposure (1 of 2)]
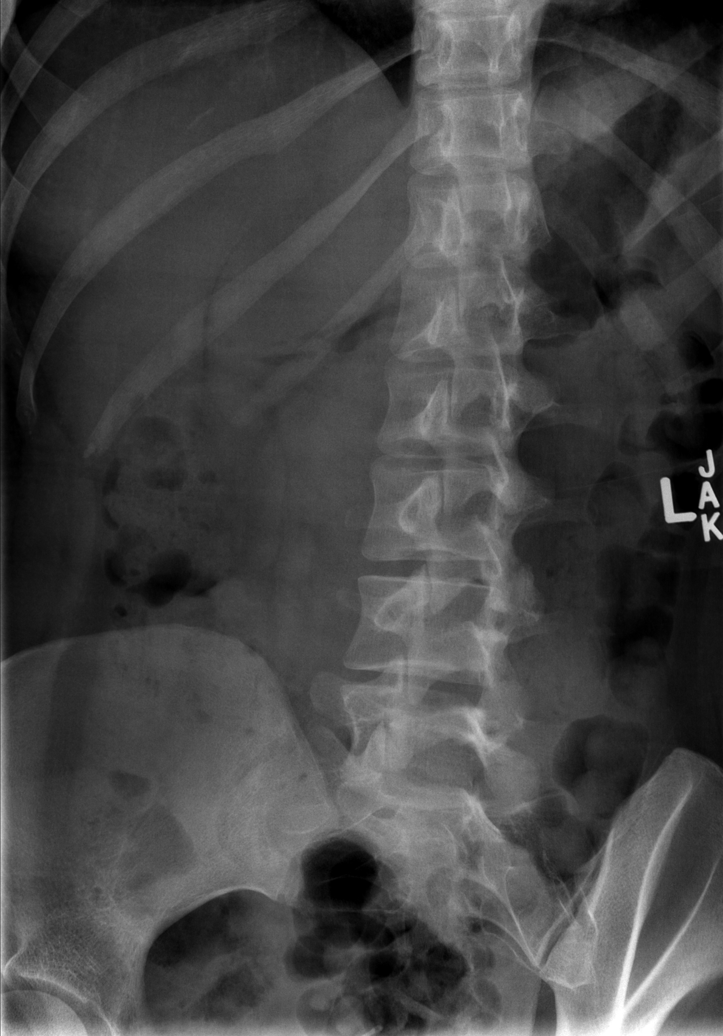

[t l-spine oblique exposure (2 of 2)]
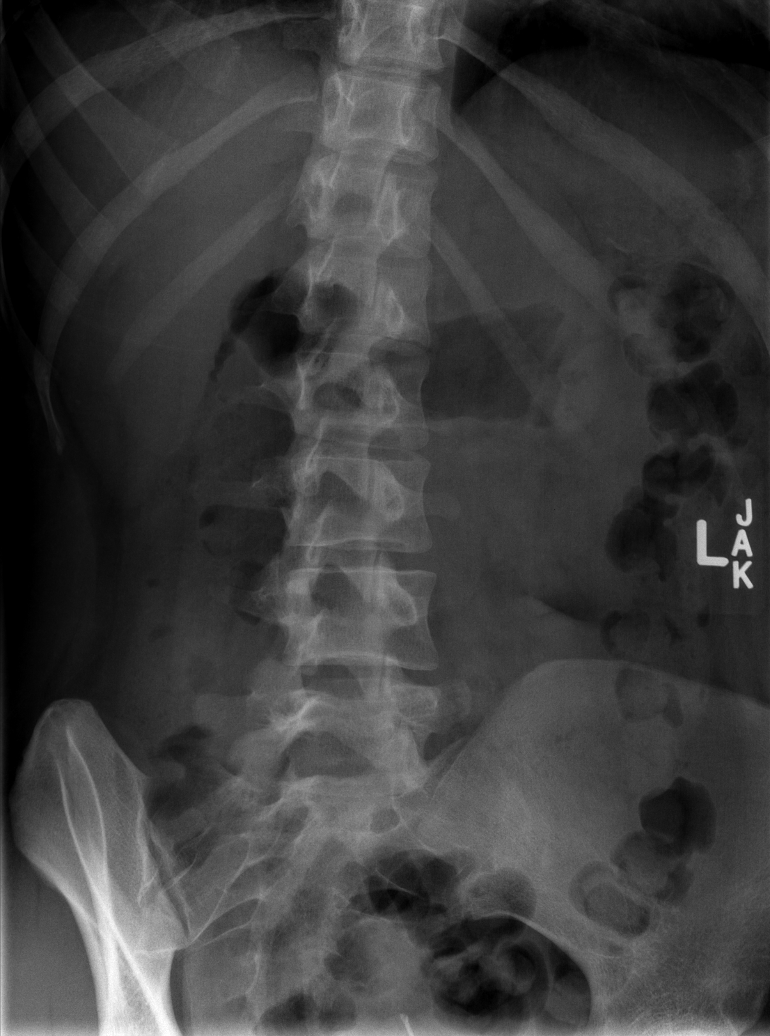

[t l-spine lat]
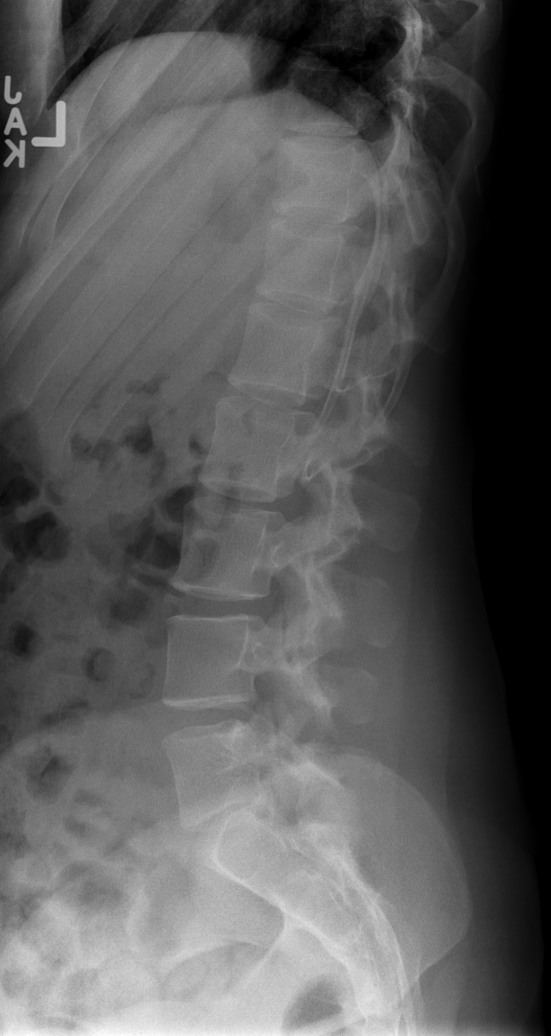

[t l-spine l5-s1 spot]
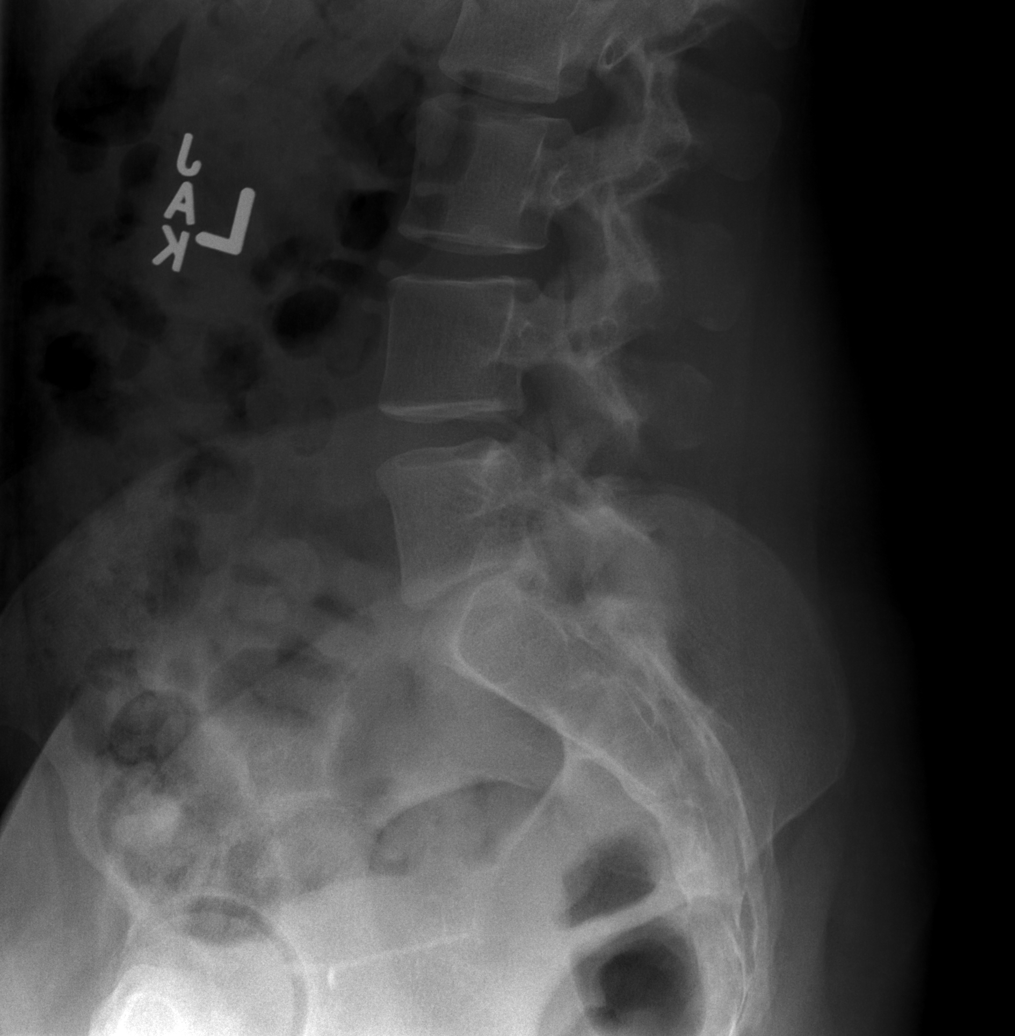

[5 of 5 positions shown; findings below may reference images not displayed]

FINDINGS: Frontal, lateral, spot lumbosacral lateral, and bilateral oblique
views were obtained. The there are 5 non-rib-bearing lumbar type
vertebral bodies. There is no fracture or spondylolisthesis. There
is moderate disc space narrowing at L5-S1. Other disc spaces appear
normal. There is no appreciable facet osteoarthritic change.
Intrauterine device is positioned in the mid-pelvis.
IMPRESSION: Disc space narrowing at L5-S1. Other disc spaces appear
unremarkable. No appreciable facet arthropathy. No fracture or
spondylolisthesis. Intrauterine device positioned in mid pelvis.

## 2019-02-22 DIAGNOSIS — F3281 Premenstrual dysphoric disorder: Secondary | ICD-10-CM | POA: Diagnosis not present

## 2019-03-01 DIAGNOSIS — F3281 Premenstrual dysphoric disorder: Secondary | ICD-10-CM | POA: Diagnosis not present

## 2019-03-04 DIAGNOSIS — Z03818 Encounter for observation for suspected exposure to other biological agents ruled out: Secondary | ICD-10-CM | POA: Diagnosis not present

## 2019-03-04 DIAGNOSIS — Z20828 Contact with and (suspected) exposure to other viral communicable diseases: Secondary | ICD-10-CM | POA: Diagnosis not present

## 2019-03-29 DIAGNOSIS — Z20828 Contact with and (suspected) exposure to other viral communicable diseases: Secondary | ICD-10-CM | POA: Diagnosis not present

## 2019-03-29 DIAGNOSIS — Z03818 Encounter for observation for suspected exposure to other biological agents ruled out: Secondary | ICD-10-CM | POA: Diagnosis not present

## 2019-04-19 DIAGNOSIS — F3281 Premenstrual dysphoric disorder: Secondary | ICD-10-CM | POA: Diagnosis not present

## 2019-04-20 DIAGNOSIS — Z23 Encounter for immunization: Secondary | ICD-10-CM | POA: Diagnosis not present

## 2019-05-11 DIAGNOSIS — Z23 Encounter for immunization: Secondary | ICD-10-CM | POA: Diagnosis not present

## 2019-05-12 DIAGNOSIS — F3281 Premenstrual dysphoric disorder: Secondary | ICD-10-CM | POA: Diagnosis not present

## 2019-05-31 DIAGNOSIS — F3281 Premenstrual dysphoric disorder: Secondary | ICD-10-CM | POA: Diagnosis not present

## 2019-07-22 DIAGNOSIS — Z20822 Contact with and (suspected) exposure to covid-19: Secondary | ICD-10-CM | POA: Diagnosis not present

## 2019-07-22 DIAGNOSIS — R0981 Nasal congestion: Secondary | ICD-10-CM | POA: Diagnosis not present

## 2019-07-22 DIAGNOSIS — Z03818 Encounter for observation for suspected exposure to other biological agents ruled out: Secondary | ICD-10-CM | POA: Diagnosis not present

## 2019-07-26 DIAGNOSIS — F3281 Premenstrual dysphoric disorder: Secondary | ICD-10-CM | POA: Diagnosis not present

## 2019-08-28 DIAGNOSIS — F3281 Premenstrual dysphoric disorder: Secondary | ICD-10-CM | POA: Diagnosis not present

## 2019-09-08 DIAGNOSIS — F3281 Premenstrual dysphoric disorder: Secondary | ICD-10-CM | POA: Diagnosis not present

## 2019-09-26 DIAGNOSIS — F410 Panic disorder [episodic paroxysmal anxiety] without agoraphobia: Secondary | ICD-10-CM | POA: Diagnosis not present

## 2019-09-26 DIAGNOSIS — F4322 Adjustment disorder with anxiety: Secondary | ICD-10-CM | POA: Diagnosis not present

## 2019-09-26 DIAGNOSIS — F411 Generalized anxiety disorder: Secondary | ICD-10-CM | POA: Diagnosis not present

## 2019-09-29 DIAGNOSIS — F3281 Premenstrual dysphoric disorder: Secondary | ICD-10-CM | POA: Diagnosis not present

## 2019-10-03 DIAGNOSIS — Z0000 Encounter for general adult medical examination without abnormal findings: Secondary | ICD-10-CM | POA: Diagnosis not present

## 2019-10-03 DIAGNOSIS — Z23 Encounter for immunization: Secondary | ICD-10-CM | POA: Diagnosis not present

## 2019-10-13 DIAGNOSIS — Z111 Encounter for screening for respiratory tuberculosis: Secondary | ICD-10-CM | POA: Diagnosis not present

## 2019-11-03 DIAGNOSIS — F3281 Premenstrual dysphoric disorder: Secondary | ICD-10-CM | POA: Diagnosis not present

## 2019-11-10 DIAGNOSIS — R059 Cough, unspecified: Secondary | ICD-10-CM | POA: Diagnosis not present

## 2019-12-02 DIAGNOSIS — F3281 Premenstrual dysphoric disorder: Secondary | ICD-10-CM | POA: Diagnosis not present

## 2020-01-19 DIAGNOSIS — Z20822 Contact with and (suspected) exposure to covid-19: Secondary | ICD-10-CM | POA: Diagnosis not present

## 2020-01-20 DIAGNOSIS — F3281 Premenstrual dysphoric disorder: Secondary | ICD-10-CM | POA: Diagnosis not present

## 2021-12-14 ENCOUNTER — Encounter: Payer: Self-pay | Admitting: Internal Medicine
